# Patient Record
Sex: Female | Born: 1955 | Race: White | Hispanic: No | Marital: Married | Smoking: Never smoker
Health system: Western US, Academic
[De-identification: ages and names within clinical notes are randomized; demographics above are authoritative.]

## PROBLEM LIST (undated history)

## (undated) DIAGNOSIS — K589 Irritable bowel syndrome without diarrhea: Secondary | ICD-10-CM

## (undated) DIAGNOSIS — T753XXA Motion sickness, initial encounter: Secondary | ICD-10-CM

## (undated) DIAGNOSIS — M2550 Pain in unspecified joint: Secondary | ICD-10-CM

## (undated) DIAGNOSIS — B019 Varicella without complication: Secondary | ICD-10-CM

## (undated) DIAGNOSIS — M1990 Unspecified osteoarthritis, unspecified site: Secondary | ICD-10-CM

## (undated) DIAGNOSIS — G709 Myoneural disorder, unspecified: Secondary | ICD-10-CM

## (undated) DIAGNOSIS — T07XXXA Unspecified multiple injuries, initial encounter: Secondary | ICD-10-CM

## (undated) DIAGNOSIS — M549 Dorsalgia, unspecified: Secondary | ICD-10-CM

## (undated) DIAGNOSIS — K219 Gastro-esophageal reflux disease without esophagitis: Secondary | ICD-10-CM

## (undated) DIAGNOSIS — T7840XA Allergy, unspecified, initial encounter: Secondary | ICD-10-CM

## (undated) DIAGNOSIS — E669 Obesity, unspecified: Secondary | ICD-10-CM

## (undated) DIAGNOSIS — I10 Essential (primary) hypertension: Secondary | ICD-10-CM

## (undated) DIAGNOSIS — F419 Anxiety disorder, unspecified: Secondary | ICD-10-CM

## (undated) DIAGNOSIS — K279 Peptic ulcer, site unspecified, unspecified as acute or chronic, without hemorrhage or perforation: Secondary | ICD-10-CM

## (undated) DIAGNOSIS — I209 Angina pectoris, unspecified: Secondary | ICD-10-CM

## (undated) DIAGNOSIS — M810 Age-related osteoporosis without current pathological fracture: Secondary | ICD-10-CM

## (undated) DIAGNOSIS — R42 Dizziness and giddiness: Secondary | ICD-10-CM

## (undated) DIAGNOSIS — G4730 Sleep apnea, unspecified: Secondary | ICD-10-CM

## (undated) DIAGNOSIS — F509 Eating disorder, unspecified: Secondary | ICD-10-CM

## (undated) HISTORY — DX: Sleep apnea, unspecified: G47.30

## (undated) HISTORY — PX: TONSILLECTOMY: SHX179

## (undated) HISTORY — DX: Myoneural disorder, unspecified (CMS-HCC): G70.9

## (undated) HISTORY — PX: COLONOSCOPY: SHX174

## (undated) HISTORY — DX: Obesity, unspecified: E66.9

## (undated) HISTORY — DX: Varicella without complication: B01.9

## (undated) HISTORY — DX: Gastro-esophageal reflux disease without esophagitis: K21.9

## (undated) HISTORY — DX: Anxiety disorder, unspecified: F41.9

## (undated) HISTORY — DX: Essential (primary) hypertension: I10

## (undated) HISTORY — PX: FOOT SURGERY: SHX648

## (undated) HISTORY — DX: Irritable bowel syndrome without diarrhea: K58.9

## (undated) HISTORY — PX: CYSTOSCOPY: SHX1422B

## (undated) HISTORY — DX: Allergy, unspecified, initial encounter: T78.40XA

## (undated) HISTORY — DX: Dizziness and giddiness: R42

## (undated) HISTORY — DX: Dorsalgia, unspecified: M54.9

## (undated) HISTORY — DX: Peptic ulcer, site unspecified, unspecified as acute or chronic, without hemorrhage or perforation: K27.9

## (undated) HISTORY — DX: Angina pectoris, unspecified (CMS-HCC): I20.9

## (undated) HISTORY — PX: PB ENLARGE BREAST WITH IMPLANT: 19325

## (undated) HISTORY — PX: NASAL SEPTUM SURGERY: SHX37

## (undated) HISTORY — DX: Age-related osteoporosis without current pathological fracture: M81.0

## (undated) HISTORY — PX: TOE SURGERY: SHX1073

## (undated) HISTORY — DX: Motion sickness, initial encounter: T75.3XXA

## (undated) HISTORY — PX: DILATION AND CURETTAGE OF UTERUS: SHX78

## (undated) HISTORY — DX: Unspecified multiple injuries, initial encounter: T07.XXXA

## (undated) HISTORY — DX: Eating disorder, unspecified: F50.9

## (undated) HISTORY — DX: Unspecified osteoarthritis, unspecified site: M19.90

## (undated) HISTORY — DX: Pain in unspecified joint: M25.50

---

## 2020-07-27 ENCOUNTER — Inpatient Hospital Stay: Admit: 2020-07-27 | Discharge: 2020-07-27 | Disposition: A | Discharge status: Home Health | Payer: Self-pay

## 2020-08-07 ENCOUNTER — Telehealth: Payer: Self-pay | Admitting: Orthopaedic Surgery of the Spine

## 2020-08-07 NOTE — Telephone Encounter (Signed)
New patient calling to request sooner appt with Dr.Bhatia. She is currently schedule for next available. She is coming in for back pain. She also does not have any recent mri. Please assist thank you.    Ph: [phone]

## 2020-10-28 ENCOUNTER — Encounter: Payer: Self-pay | Admitting: Orthopaedic Surgery of the Spine

## 2020-10-30 ENCOUNTER — Other Ambulatory Visit: Payer: Self-pay

## 2020-10-30 ENCOUNTER — Encounter: Payer: Self-pay | Admitting: Orthopaedic Surgery of the Spine

## 2020-10-30 ENCOUNTER — Ambulatory Visit
Admit: 2020-10-30 | Discharge: 2020-10-30 | Disposition: A | Discharge status: Home Health | Payer: BC Managed Care – PPO

## 2020-10-30 ENCOUNTER — Ambulatory Visit: Payer: BC Managed Care – PPO | Attending: Orthopaedic Surgery of the Spine | Admitting: Orthopaedic Surgery of the Spine

## 2020-10-30 ENCOUNTER — Ambulatory Visit: Payer: Self-pay | Admitting: Orthopaedic Surgery of the Spine

## 2020-10-30 VITALS — BP 117/77 | HR 80 | Temp 97.0°F | Ht 62.0 in | Wt 161.0 lb

## 2020-10-30 DIAGNOSIS — M5134 Other intervertebral disc degeneration, thoracic region: Secondary | ICD-10-CM | POA: Insufficient documentation

## 2020-10-30 DIAGNOSIS — M5136 Other intervertebral disc degeneration, lumbar region: Secondary | ICD-10-CM

## 2020-10-30 DIAGNOSIS — R1010 Upper abdominal pain, unspecified: Secondary | ICD-10-CM

## 2020-10-30 DIAGNOSIS — M4316 Spondylolisthesis, lumbar region: Secondary | ICD-10-CM

## 2020-10-30 DIAGNOSIS — Z6829 Body mass index (BMI) 29.0-29.9, adult: Secondary | ICD-10-CM

## 2020-10-30 DIAGNOSIS — M47816 Spondylosis without myelopathy or radiculopathy, lumbar region: Secondary | ICD-10-CM

## 2020-10-30 DIAGNOSIS — R52 Pain, unspecified: Secondary | ICD-10-CM | POA: Insufficient documentation

## 2020-10-30 MED ORDER — HYALURONIC ACID 100 MG PO CAPS: ORAL_CAPSULE | ORAL | Status: AC

## 2020-10-30 MED ORDER — ESOMEPRAZOLE MAGNESIUM 40 MG OR CPDR: DELAYED_RELEASE_CAPSULE | ORAL | Status: AC

## 2020-10-30 MED ORDER — VITAMIN D3 MAXIMUM STRENGTH 125 MCG (5000 UT) PO CAPS: ORAL_CAPSULE | ORAL | Status: AC

## 2020-10-30 MED ORDER — ESCITALOPRAM OXALATE 20 MG OR TABS: ORAL_TABLET | ORAL | Status: AC

## 2020-10-30 MED ORDER — BIOTIN 5000 MCG PO CAPS: ORAL_CAPSULE | ORAL | Status: AC

## 2020-10-30 MED ORDER — ALENDRONATE SODIUM 70 MG OR TABS
ORAL_TABLET | ORAL | Status: AC
Start: 2020-08-31 — End: ?

## 2020-10-30 MED ORDER — SENNA 8.6 MG OR TABS: ORAL_TABLET | ORAL | Status: AC

## 2020-10-30 MED ORDER — DULOXETINE HCL 60 MG OR CPEP
60.0000 mg | ORAL_CAPSULE | Freq: Every day | ORAL | Status: AC
Start: 2020-08-19 — End: ?

## 2020-10-30 MED ORDER — MONTELUKAST SODIUM 10 MG OR TABS: ORAL_TABLET | ORAL | Status: AC

## 2020-10-30 MED ORDER — ASPIRIN 81 MG OR CHEW: CHEWABLE_TABLET | ORAL | Status: AC

## 2020-10-30 MED ORDER — GEMTESA PO: ORAL | Status: AC

## 2020-10-30 MED ORDER — ROSUVASTATIN CALCIUM 10 MG OR TABS: ORAL_TABLET | ORAL | Status: AC

## 2020-10-30 MED ORDER — SPIRONOLACTONE 100 MG OR TABS: ORAL_TABLET | ORAL | Status: AC

## 2020-10-30 MED ORDER — AMLODIPINE 10 MG OR TABS: 10.0000 mg | ORAL_TABLET | ORAL | Status: AC

## 2020-10-30 MED ORDER — NABUMETONE 500 MG OR TABS: ORAL_TABLET | ORAL | Status: AC

## 2020-10-30 MED ORDER — CALCIUM GLUCONATE 500 MG OR TABS: 500.0000 mg | ORAL_TABLET | ORAL | Status: AC

## 2020-10-30 MED ORDER — IRON 18 MG OR TBCR: EXTENDED_RELEASE_TABLET | ORAL | Status: AC

## 2020-11-05 NOTE — Consults (Signed)
 [facility]   Dr. Wallie Char  Professor & Johnanna Schneiders, Orthopaedic Surgery  Chief, Spine Surgery  [email or web address]  [email or web address]    [address] [place], [place] [address]  [phone]    [address]  [place]

## 2022-07-12 IMAGING — CR SINUS 3 VWS MIN
4 series · 4 of 4 positions shown · non-contrast
Comparison: 04/04/2016

Sinus radiographs, 4 views
INDICATION: Congestion, persistent cough

[w smv]
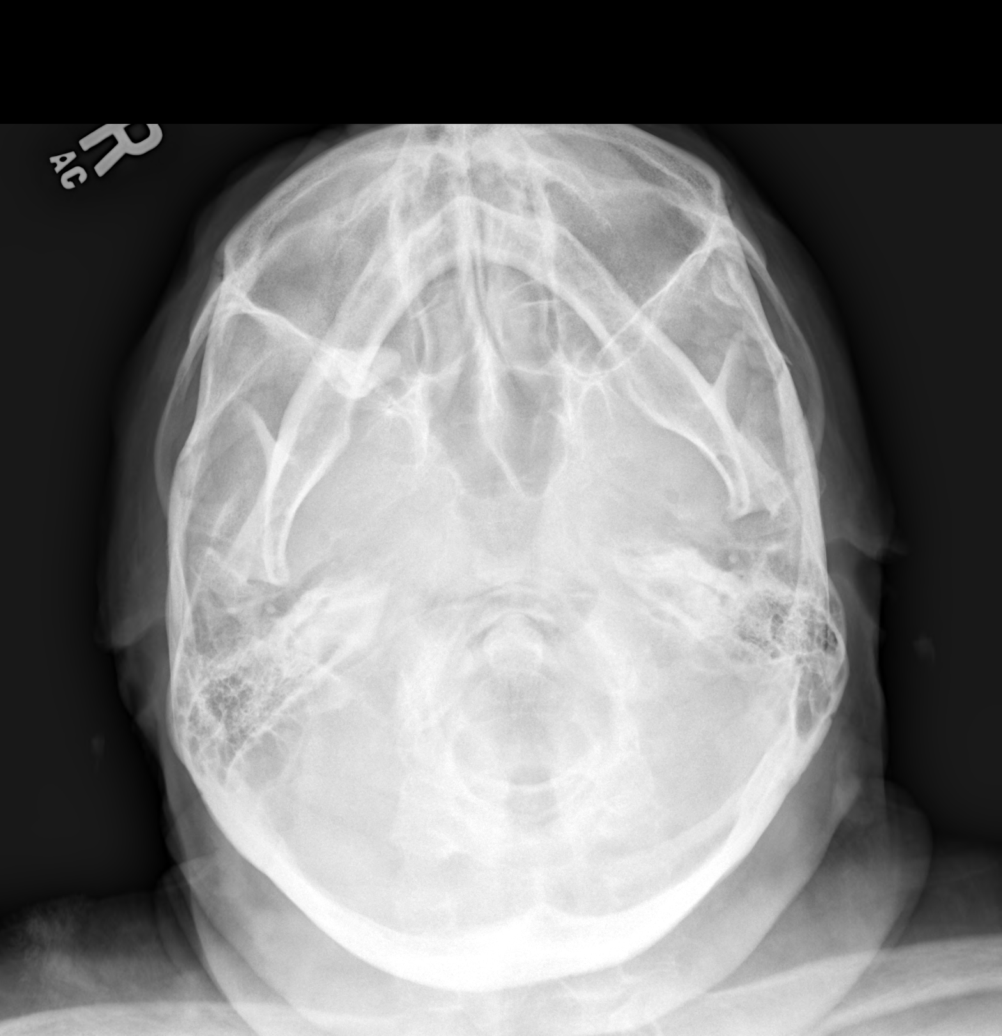

[w waters pa (1 of 2)]
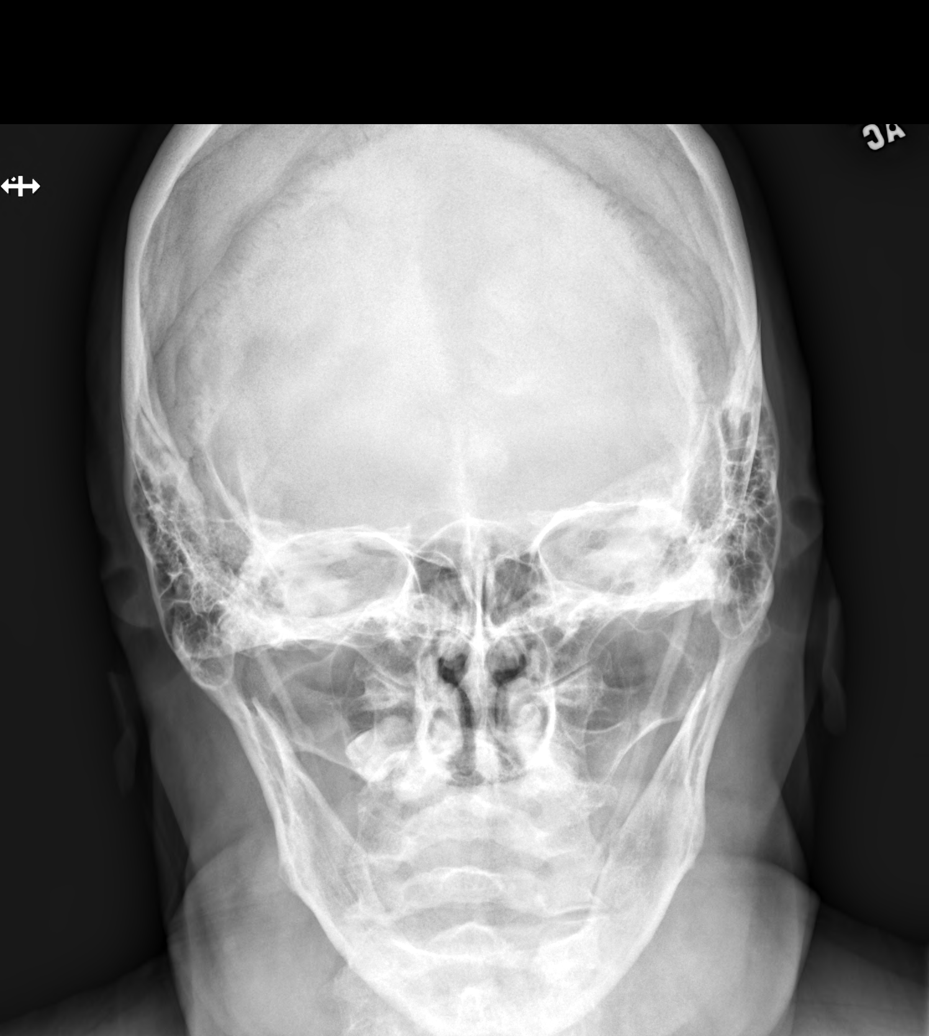

[w waters pa (2 of 2)]
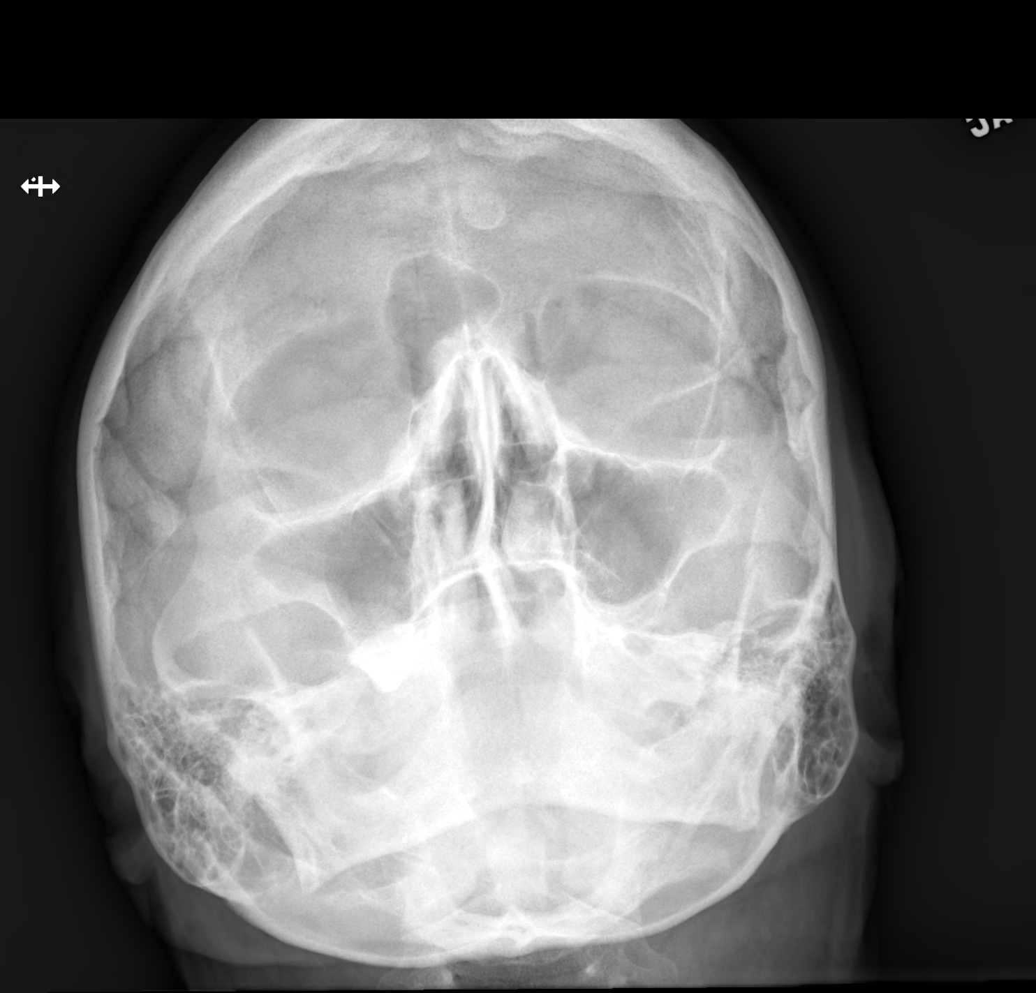

[w sinuses lat]
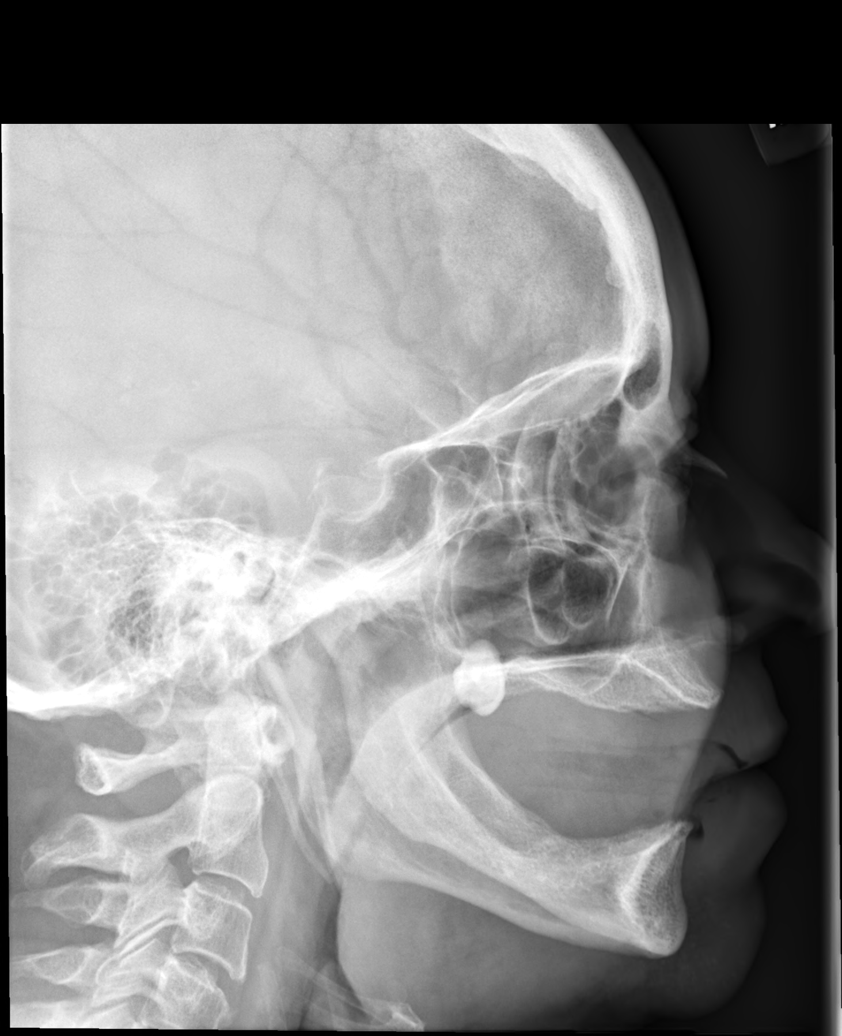

[4 of 4 positions shown; findings below may reference images not displayed]

FINDINGS: No air-fluid level. No soft tissue mass. No fracture. Nasal septum is midline.
IMPRESSION: Clear paranasal sinuses.

## 2022-07-12 IMAGING — CR CHEST 2 VWS PA LAT
2 series · 2 of 2 positions shown · non-contrast
Comparison: 08/12/2019

HISTORY: Cough
TECHNIQUE: PA and lateral chest radiographs

[w chest pa]
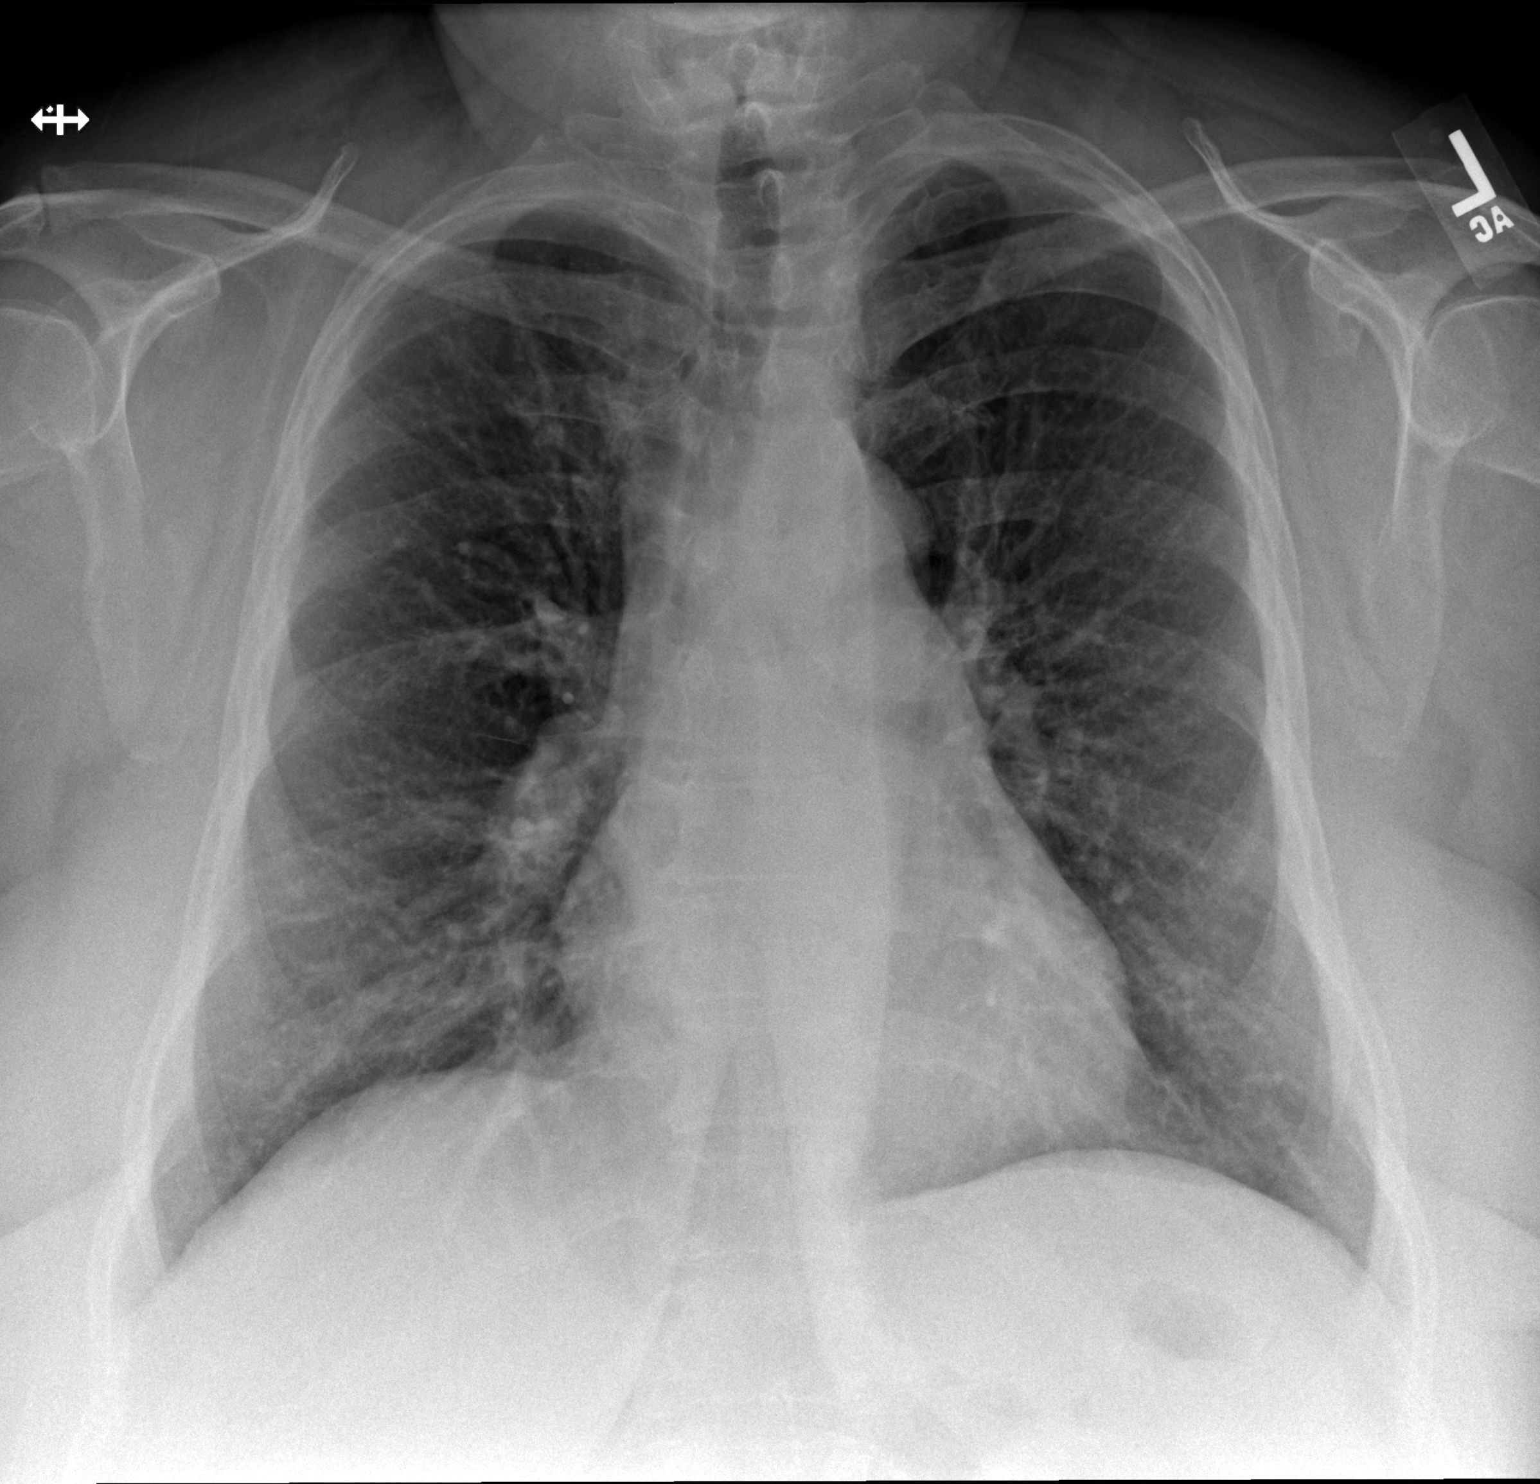

[w chest lat]
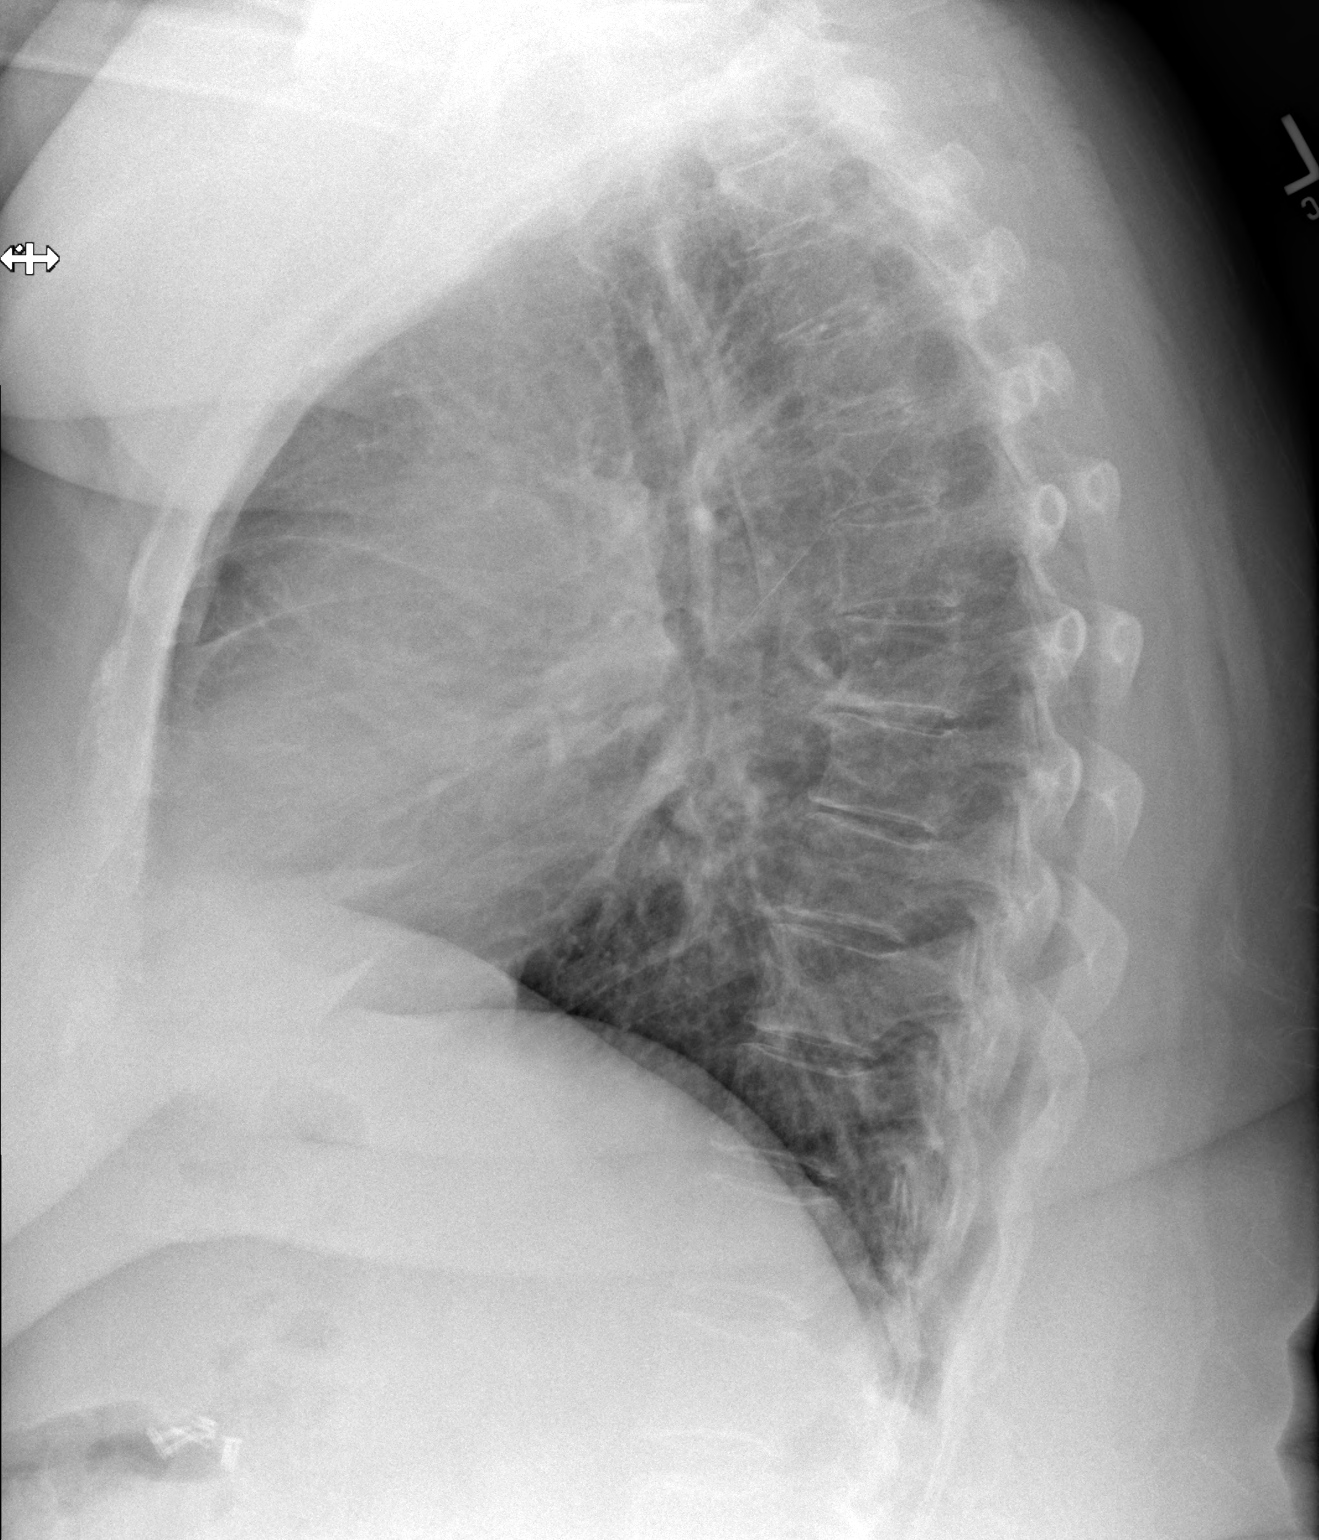

[2 of 2 positions shown; findings below may reference images not displayed]

FINDINGS: Heart size is normal. No pleural effusion or pneumothorax. No consolidation.
IMPRESSION: No acute chest findings.

## 2022-09-10 ENCOUNTER — Ambulatory Visit: Payer: Medicare Other | Admitting: Head and Neck Surgery
